# Patient Record
Sex: Female | Born: 1970 | Race: Black or African American | Hispanic: No | Marital: Single | Smoking: Current some day smoker
Health system: Southern US, Community
[De-identification: ages and names within clinical notes are randomized; demographics above are authoritative.]

## PROBLEM LIST (undated history)

## (undated) DIAGNOSIS — I639 Cerebral infarction, unspecified: Secondary | ICD-10-CM

## (undated) HISTORY — PX: APPENDECTOMY: SHX54

---

## 2015-03-13 ENCOUNTER — Emergency Department (HOSPITAL_COMMUNITY)
Admission: EM | Admit: 2015-03-13 | Discharge: 2015-03-13 | Disposition: A | Discharge status: Home / Self Care | Payer: Commercial Managed Care - HMO | Attending: Emergency Medicine | Admitting: Emergency Medicine

## 2015-03-13 ENCOUNTER — Encounter (HOSPITAL_COMMUNITY): Payer: Self-pay | Admitting: *Deleted

## 2015-03-13 ENCOUNTER — Emergency Department (HOSPITAL_COMMUNITY): Payer: Commercial Managed Care - HMO

## 2015-03-13 DIAGNOSIS — Y929 Unspecified place or not applicable: Secondary | ICD-10-CM | POA: Diagnosis not present

## 2015-03-13 DIAGNOSIS — Z720 Tobacco use: Secondary | ICD-10-CM | POA: Insufficient documentation

## 2015-03-13 DIAGNOSIS — S99911A Unspecified injury of right ankle, initial encounter: Secondary | ICD-10-CM | POA: Diagnosis present

## 2015-03-13 DIAGNOSIS — Y9389 Activity, other specified: Secondary | ICD-10-CM | POA: Diagnosis not present

## 2015-03-13 DIAGNOSIS — Z8673 Personal history of transient ischemic attack (TIA), and cerebral infarction without residual deficits: Secondary | ICD-10-CM | POA: Diagnosis not present

## 2015-03-13 DIAGNOSIS — Z880 Allergy status to penicillin: Secondary | ICD-10-CM | POA: Insufficient documentation

## 2015-03-13 DIAGNOSIS — W1849XA Other slipping, tripping and stumbling without falling, initial encounter: Secondary | ICD-10-CM | POA: Insufficient documentation

## 2015-03-13 DIAGNOSIS — S93401A Sprain of unspecified ligament of right ankle, initial encounter: Secondary | ICD-10-CM | POA: Insufficient documentation

## 2015-03-13 DIAGNOSIS — Y999 Unspecified external cause status: Secondary | ICD-10-CM | POA: Insufficient documentation

## 2015-03-13 HISTORY — DX: Cerebral infarction, unspecified: I63.9

## 2015-03-13 MED ORDER — KETOROLAC TROMETHAMINE 30 MG/ML IJ SOLN
30.0000 mg | Freq: Once | INTRAMUSCULAR | Status: AC
  Administered 2015-03-13: 30 mg via INTRAMUSCULAR
  Filled 2015-03-13: qty 1

## 2015-03-13 NOTE — Discharge Instructions (Signed)
Ankle Sprain Rest. Ice. Elevate. Follow-up with her primary care physician. An ankle sprain is an injury to the strong, fibrous tissues (ligaments) that hold the bones of your ankle joint together.  CAUSES An ankle sprain is usually caused by a fall or by twisting your ankle. Ankle sprains most commonly occur when you step on the outer edge of your foot, and your ankle turns inward. People who participate in sports are more prone to these types of injuries.  SYMPTOMS   Pain in your ankle. The pain may be present at rest or only when you are trying to stand or walk.  Swelling.  Bruising. Bruising may develop immediately or within 1 to 2 days after your injury.  Difficulty standing or walking, particularly when turning corners or changing directions. DIAGNOSIS  Your caregiver will ask you details about your injury and perform a physical exam of your ankle to determine if you have an ankle sprain. During the physical exam, your caregiver will press on and apply pressure to specific areas of your foot and ankle. Your caregiver will try to move your ankle in certain ways. An X-ray exam may be done to be sure a bone was not broken or a ligament did not separate from one of the bones in your ankle (avulsion fracture).  TREATMENT  Certain types of braces can help stabilize your ankle. Your caregiver can make a recommendation for this. Your caregiver may recommend the use of medicine for pain. If your sprain is severe, your caregiver may refer you to a surgeon who helps to restore function to parts of your skeletal system (orthopedist) or a physical therapist. HOME CARE INSTRUCTIONS   Apply ice to your injury for 1-2 days or as directed by your caregiver. Applying ice helps to reduce inflammation and pain.  Put ice in a plastic bag.  Place a towel between your skin and the bag.  Leave the ice on for 15-20 minutes at a time, every 2 hours while you are awake.  Only take over-the-counter or  prescription medicines for pain, discomfort, or fever as directed by your caregiver.  Elevate your injured ankle above the level of your heart as much as possible for 2-3 days.  If your caregiver recommends crutches, use them as instructed. Gradually put weight on the affected ankle. Continue to use crutches or a cane until you can walk without feeling pain in your ankle.  If you have a plaster splint, wear the splint as directed by your caregiver. Do not rest it on anything harder than a pillow for the first 24 hours. Do not put weight on it. Do not get it wet. You may take it off to take a shower or bath.  You may have been given an elastic bandage to wear around your ankle to provide support. If the elastic bandage is too tight (you have numbness or tingling in your foot or your foot becomes cold and blue), adjust the bandage to make it comfortable.  If you have an air splint, you may blow more air into it or let air out to make it more comfortable. You may take your splint off at night and before taking a shower or bath. Wiggle your toes in the splint several times per day to decrease swelling. SEEK MEDICAL CARE IF:   You have rapidly increasing bruising or swelling.  Your toes feel extremely cold or you lose feeling in your foot.  Your pain is not relieved with medicine. SEEK IMMEDIATE MEDICAL  CARE IF:  Your toes are numb or blue.  You have severe pain that is increasing. MAKE SURE YOU:   Understand these instructions.  Will watch your condition.  Will get help right away if you are not doing well or get worse. Document Released: 07/24/2005 Document Revised: 04/17/2012 Document Reviewed: 08/05/2011 [facility] Patient Information 2015 [place], [place]. This information is not intended to replace advice given to you by your health care provider. Make sure you discuss any questions you have with your health care provider.

## 2015-03-13 NOTE — ED Provider Notes (Signed)
CSN: [id number]     Arrival date & time 03/13/15  1905 History  This chart was scribed for Shawna Gosselin, PA-C, working with Elwin Mocha, MD by Elon Spanner, ED Scribe. This patient was seen in room TR11C/TR11C and the patient's care was started at 7:34 PM.   Chief Complaint  Patient presents with  . Ankle Injury   The history is provided by the patient. No language interpreter was used.    HPI Comments: Shawna Alvarez is a 44 y.o. female with a history of a stroke with right-sided deficit (anticoagulated with aspirin) who presents to the Emergency Department complaining of a mechanical fall that occurred last night.  The patient reports she tripped over her bed post, causing pain and swelling in the left ankle.  The patient has not taken any medication for this complaint.  Patient intermittently uses a cane for ambulation and has relied on it more heavily since the incident.  She is able to ambulated with increased pain.  She denies use of anti-coagulants.  She denies previous injury/surgery to left foot.  There are no other complaints.     Past Medical History  Diagnosis Date  . Stroke    Past Surgical History  Procedure Laterality Date  . Appendectomy     No family history on file. History  Substance Use Topics  . Smoking status: Current Some Day Smoker  . Smokeless tobacco: Never Used  . Alcohol Use: Yes   OB History    No data available     Review of Systems  Constitutional: Negative for fever.  Musculoskeletal: Positive for joint swelling and arthralgias.      Allergies  Penicillins  Home Medications   Prior to Admission medications   Not on File   BP 136/88 mmHg  Pulse 94  Temp(Src) 98.5 F (36.9 C) (Oral)  Resp 18  Ht  (1.651 m)  Wt 170 lb (77.111 kg)  BMI 28.29 kg/m2  SpO2 95%  LMP 03/02/2015 Physical Exam  Constitutional: She is oriented to person, place, and time. She appears well-developed and well-nourished. No distress.  HENT:   Head: Normocephalic and atraumatic.  Eyes: Conjunctivae and EOM are normal.  Neck: Neck supple. No tracheal deviation present.  Cardiovascular: Normal rate.   Pulmonary/Chest: Effort normal. No respiratory distress.  Musculoskeletal: Normal range of motion.  DP pulse cannot be palpated secondary to swelling.  significant swelling and edema to right lateral and medial malleolus.    Neurological: She is alert and oriented to person, place, and time.  Skin: Skin is warm and dry.  Psychiatric: She has a normal mood and affect. Her behavior is normal.  Nursing note and vitals reviewed.   ED Course  Procedures (including critical care time)  DIAGNOSTIC STUDIES: Oxygen Saturation is 95% on RA, normal by my interpretation.    COORDINATION OF CARE:  7:39 PM Discussed treatment plan with patient at bedside.  Patient acknowledges and agrees with plan.    Labs Review Labs Reviewed - No data to display  Imaging Review Dg Ankle Complete Right  03/13/2015   CLINICAL DATA:  RIGHT ankle pain, swelling, and bruising, fell yesterday  EXAM: RIGHT ANKLE - COMPLETE 3+ VIEW  COMPARISON:  None  FINDINGS: Marked soft tissue swelling.  Joint alignments normal.  Osseous mineralization normal.  No definite acute fracture, dislocation or bone destruction.  Two tiny thin questionable calcific densities versus artifacts are identified on lateral view dorsal to the talus, without known donor site, of  uncertain significance.  IMPRESSION: Two tiny questionable calcific densities versus artifacts dorsal to the talus on lateral view of uncertain etiology and significance.  No definite acute fracture or dislocation identified.  Marked soft tissue swelling.   Electronically Signed   By: Ulyses Southward M.D.   On: 03/13/2015 20:17     EKG Interpretation None      MDM   Final diagnoses:  Ankle sprain, right, initial encounter   Patient presents for right ankle swelling secondary to mechanical fall. X-ray shows 2 tiny  questionable calcific densities versus artifact on the dorsal talus. There is no acute fracture dislocation. There is significant soft tissue swelling. Patient was placed in a ASO ankle splint and given ice. I discussed RICE. She was not given crutches secondary to right-sided weakness from previous stroke. I discussed taking Tylenol for pain. And she verbally agrees with the plan. I personally performed the services described in this documentation, which was scribed in my presence. The recorded information has been reviewed and is accurate.    Shawna Gosselin, PA-C 03/13/15 2055  Elwin Mocha, MD 03/13/15 [phone]

## 2015-03-13 NOTE — ED Notes (Signed)
Back from xray

## 2015-03-13 NOTE — ED Notes (Signed)
Taken to xray at this time via stretcher. 

## 2015-03-13 NOTE — ED Notes (Signed)
Patient stated she got up to go to the bathroom last night and tripped over the bed leg.  Right ankle and foot swollen  patien stated she had a stroke on he right side in 2014 and may have contributed to her falling due to the weakness on the right side)

## 2016-02-18 IMAGING — CR DG ANKLE COMPLETE 3+V*R*
3 series · 3 of 3 positions shown · non-contrast
Comparison: None

CLINICAL DATA: RIGHT ankle pain, swelling, and bruising, fell
yesterday

EXAM:
RIGHT ANKLE - COMPLETE 3+ VIEW

[ankle ap]
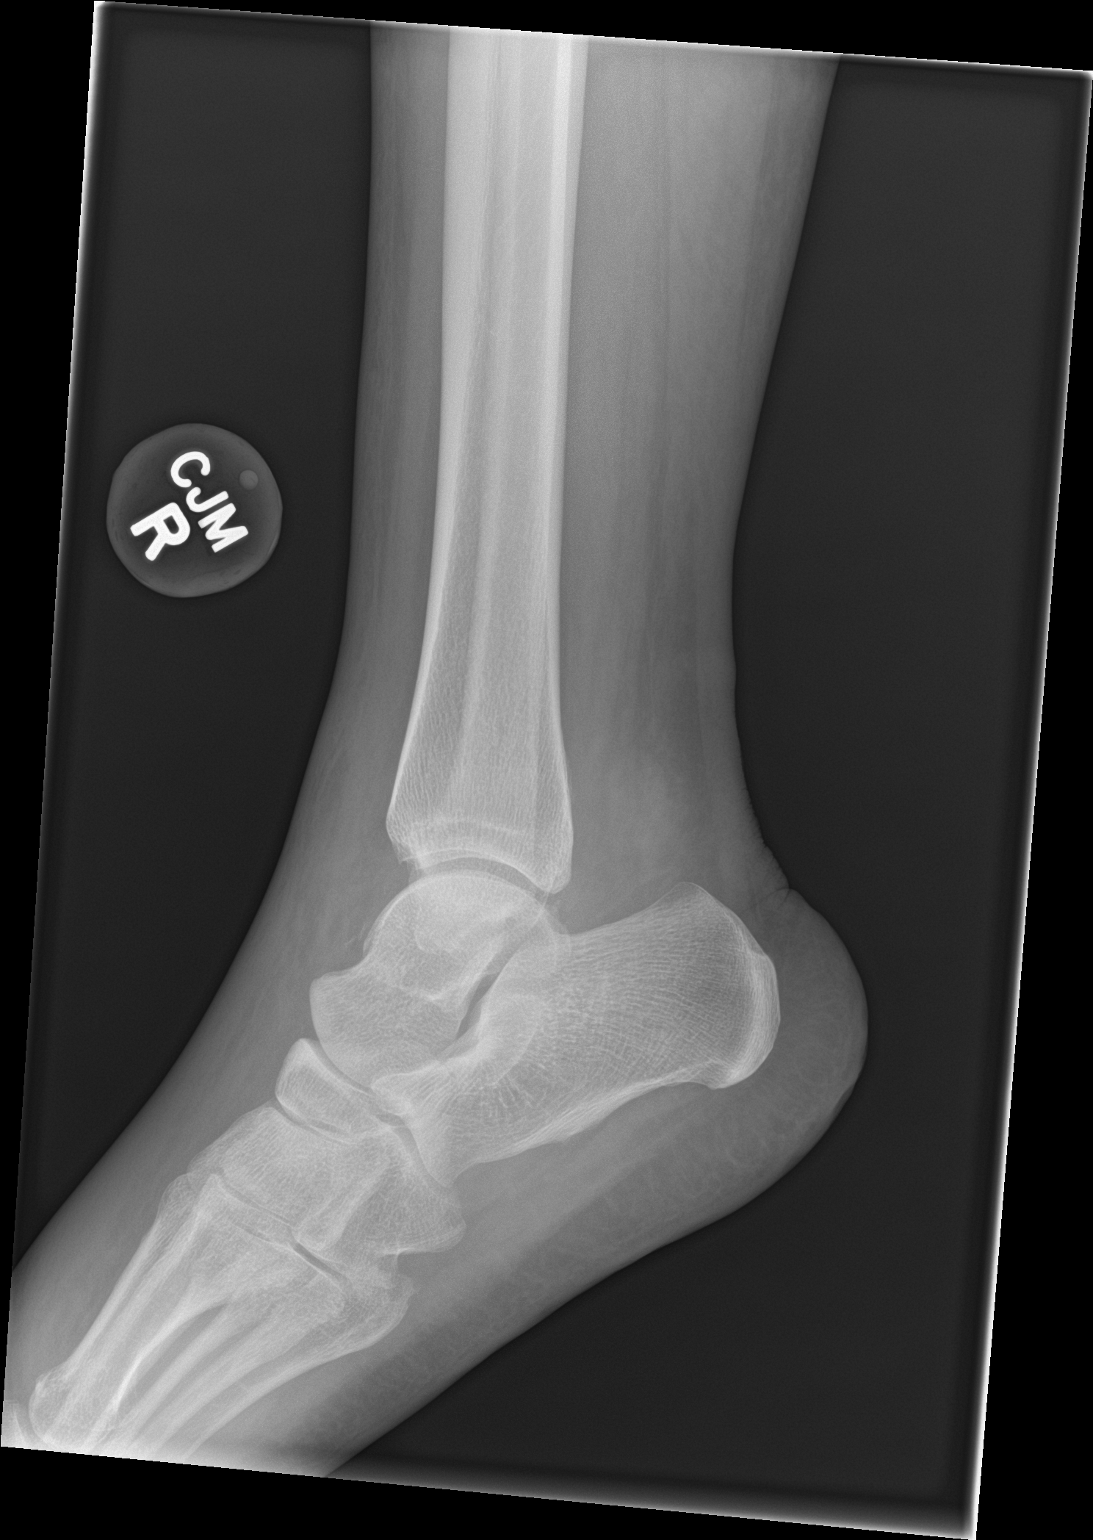

[ankle obl]
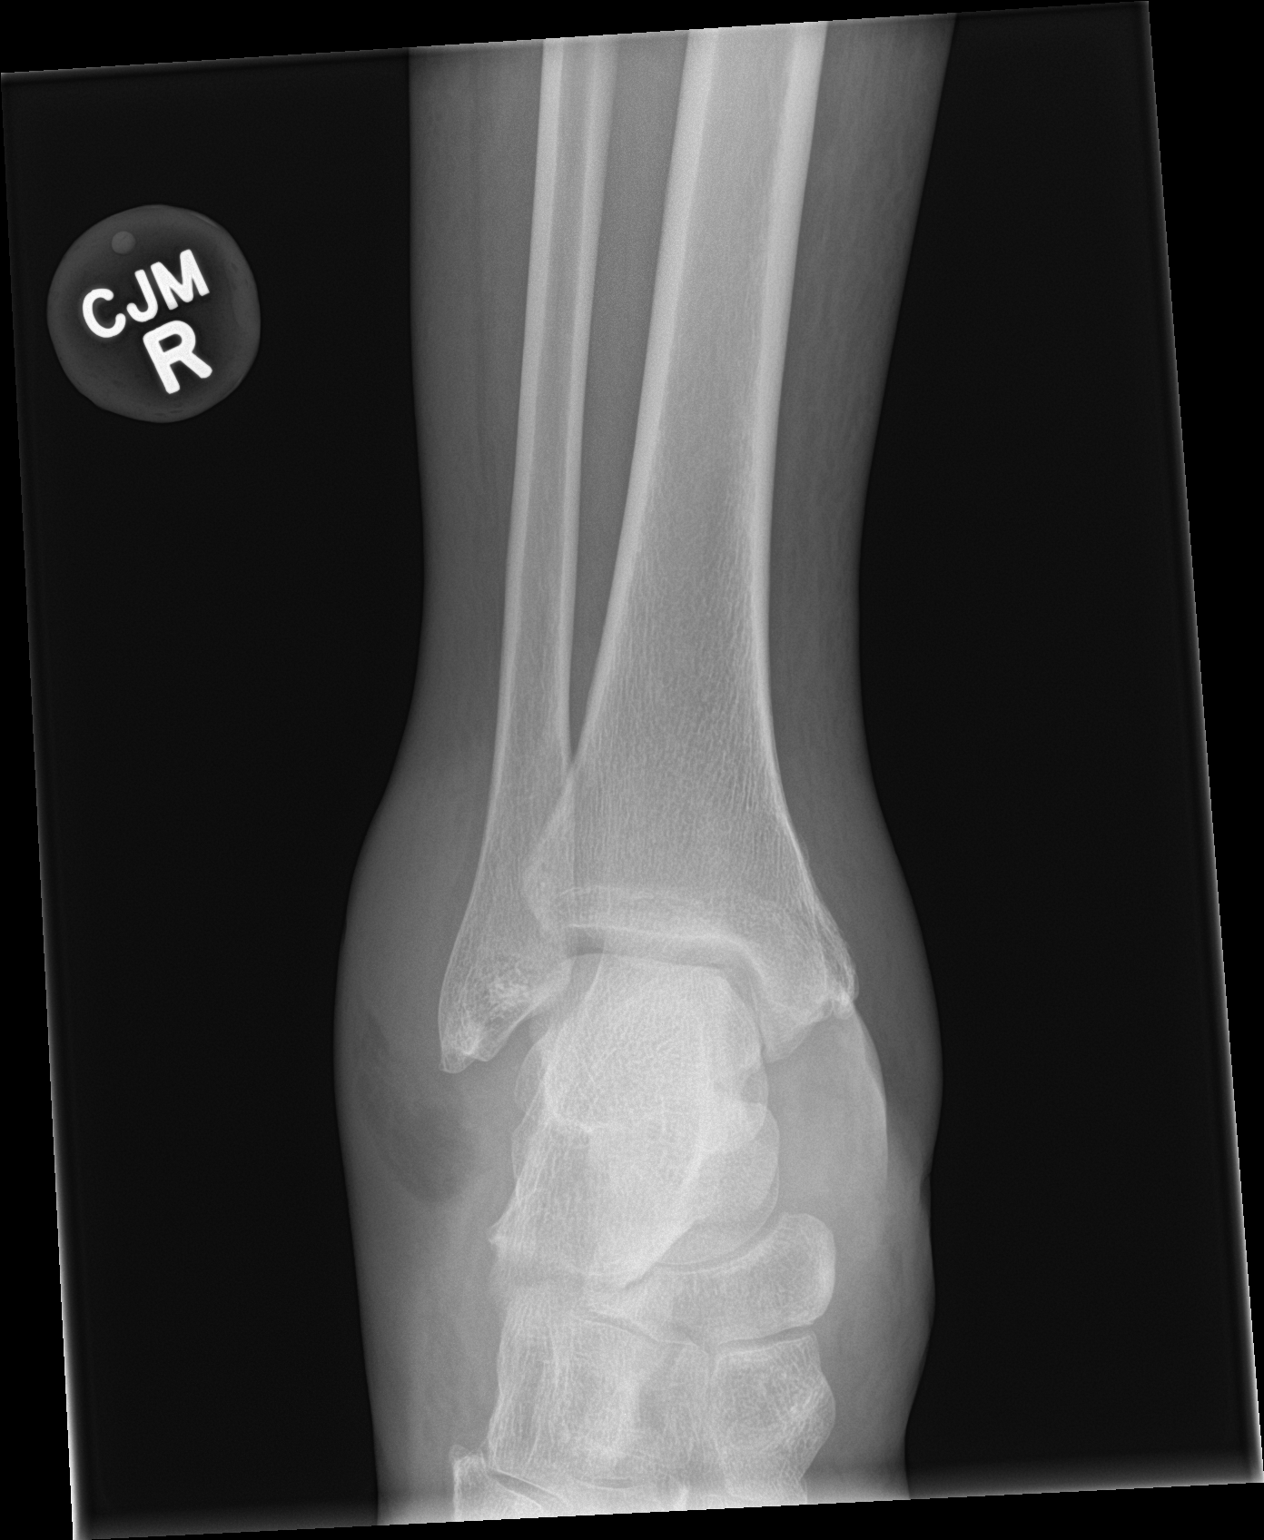

[ankle lat]
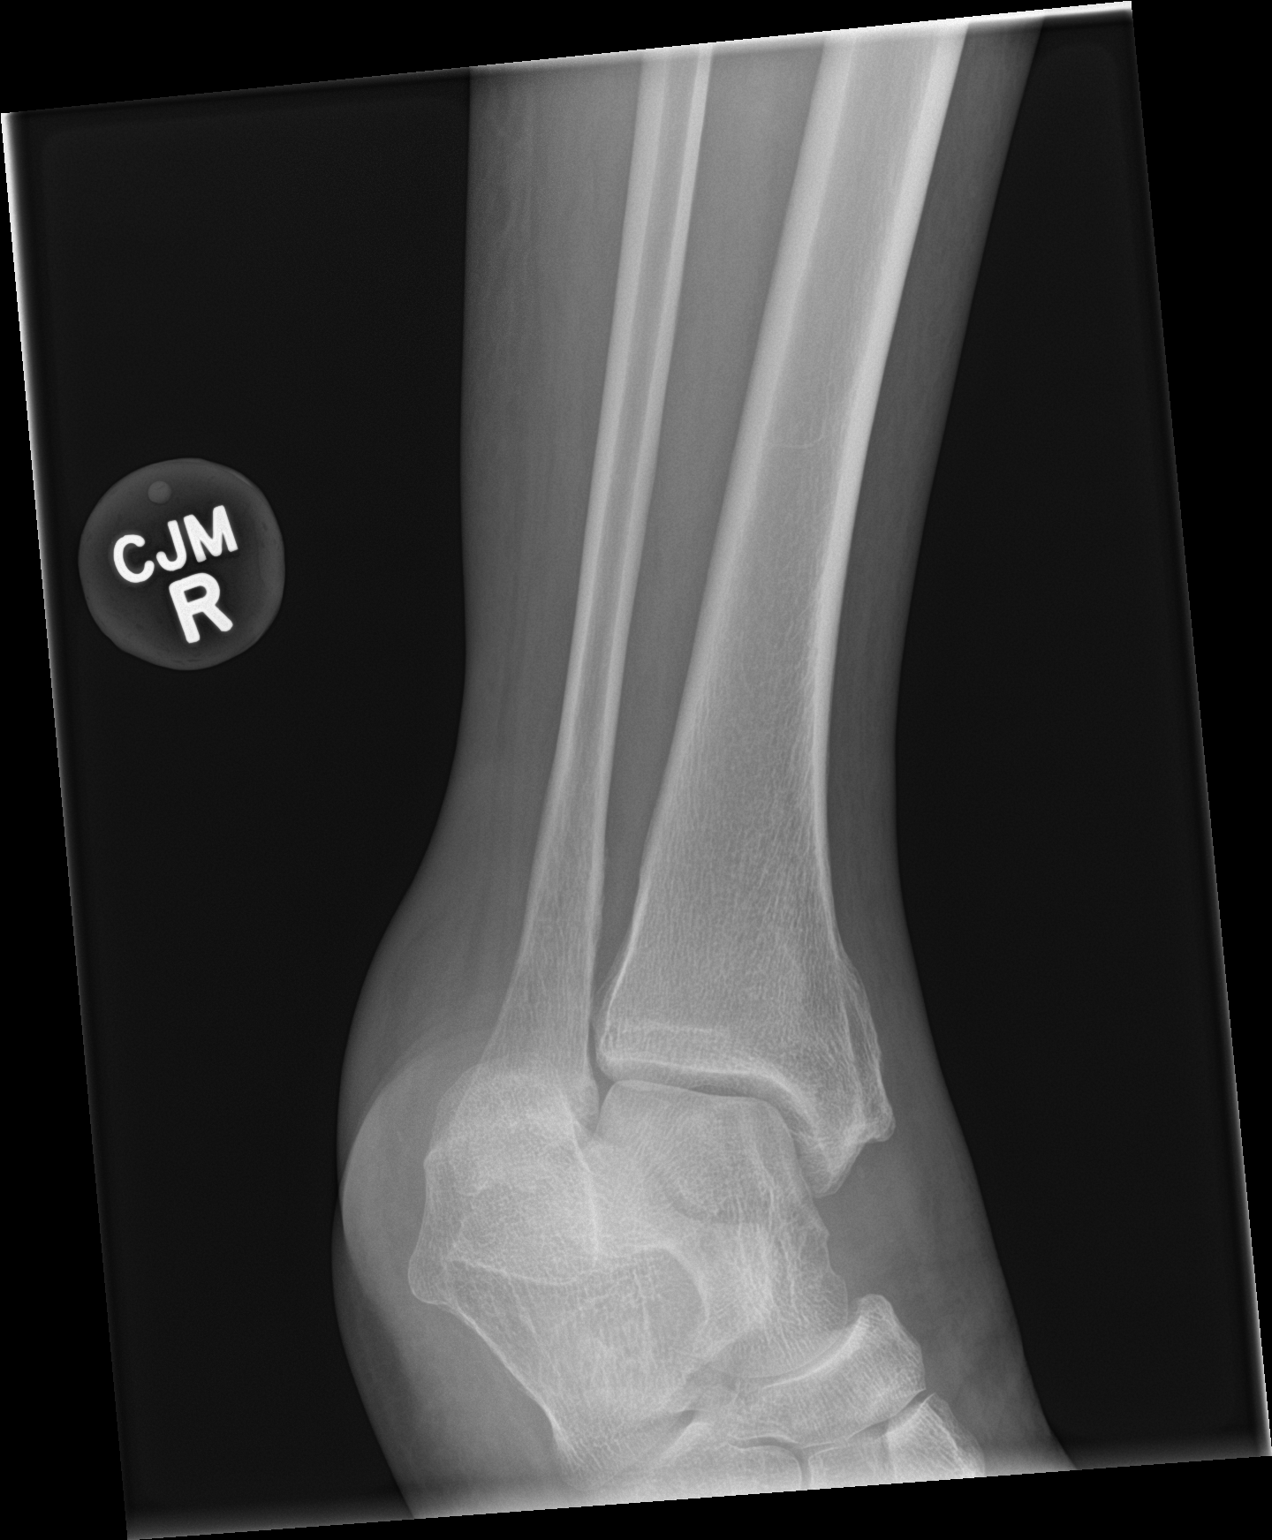

[3 of 3 positions shown; findings below may reference images not displayed]

FINDINGS: Marked soft tissue swelling.

Joint alignments normal.

Osseous mineralization normal.

No definite acute fracture, dislocation or bone destruction.

Two tiny thin questionable calcific densities versus artifacts are
identified on lateral view dorsal to the talus, without known donor
site, of uncertain significance.
IMPRESSION: Two tiny questionable calcific densities versus artifacts dorsal to
the talus on lateral view of uncertain etiology and significance.

No definite acute fracture or dislocation identified.

Marked soft tissue swelling.

## 2017-01-05 DEATH — deceased
